# Patient Record
Sex: Male | Born: 1990 | Hispanic: Yes | Marital: Single | Smoking: Never smoker
Health system: Southern US, Community
[De-identification: ages and names within clinical notes are randomized; demographics above are authoritative.]

## PROBLEM LIST (undated history)

## (undated) ENCOUNTER — Emergency Department (HOSPITAL_COMMUNITY): Admission: EM | Payer: No Typology Code available for payment source | Source: Home / Self Care

---

## 2018-08-31 ENCOUNTER — Emergency Department (HOSPITAL_COMMUNITY): Payer: No Typology Code available for payment source

## 2018-08-31 ENCOUNTER — Other Ambulatory Visit: Payer: Self-pay

## 2018-08-31 ENCOUNTER — Encounter (HOSPITAL_COMMUNITY): Payer: Self-pay | Admitting: *Deleted

## 2018-08-31 ENCOUNTER — Emergency Department (HOSPITAL_COMMUNITY)
Admission: EM | Admit: 2018-08-31 | Discharge: 2018-08-31 | Disposition: A | Discharge status: Home / Self Care | Payer: No Typology Code available for payment source | Attending: Emergency Medicine | Admitting: Emergency Medicine

## 2018-08-31 DIAGNOSIS — Y999 Unspecified external cause status: Secondary | ICD-10-CM | POA: Diagnosis not present

## 2018-08-31 DIAGNOSIS — Y9389 Activity, other specified: Secondary | ICD-10-CM | POA: Diagnosis not present

## 2018-08-31 DIAGNOSIS — S8001XA Contusion of right knee, initial encounter: Secondary | ICD-10-CM | POA: Diagnosis not present

## 2018-08-31 DIAGNOSIS — S00512A Abrasion of oral cavity, initial encounter: Secondary | ICD-10-CM | POA: Insufficient documentation

## 2018-08-31 DIAGNOSIS — Y92410 Unspecified street and highway as the place of occurrence of the external cause: Secondary | ICD-10-CM | POA: Insufficient documentation

## 2018-08-31 DIAGNOSIS — S0993XA Unspecified injury of face, initial encounter: Secondary | ICD-10-CM | POA: Diagnosis present

## 2018-08-31 DIAGNOSIS — S1093XA Contusion of unspecified part of neck, initial encounter: Secondary | ICD-10-CM

## 2018-08-31 MED ORDER — NAPROXEN 500 MG PO TABS: 500.0000 mg | ORAL_TABLET | Freq: Two times a day (BID) | ORAL | 0 refills | Status: AC

## 2018-08-31 MED ORDER — IBUPROFEN 400 MG PO TABS
600.0000 mg | ORAL_TABLET | Freq: Once | ORAL | Status: AC
  Administered 2018-08-31: 600 mg via ORAL
  Filled 2018-08-31: qty 1

## 2018-08-31 MED ORDER — CYCLOBENZAPRINE HCL 10 MG PO TABS
5.0000 mg | ORAL_TABLET | Freq: Once | ORAL | Status: AC
  Administered 2018-08-31: 5 mg via ORAL
  Filled 2018-08-31: qty 1

## 2018-08-31 MED ORDER — LIDOCAINE VISCOUS HCL 2 % MT SOLN: 15.0000 mL | OROMUCOSAL | 0 refills | Status: AC | PRN

## 2018-08-31 MED ORDER — CYCLOBENZAPRINE HCL 10 MG PO TABS: 10.0000 mg | ORAL_TABLET | Freq: Two times a day (BID) | ORAL | 0 refills | Status: AC | PRN
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
## 2018-08-31 NOTE — ED Notes (Signed)
RN informed Pt can receive visitor  

## 2018-08-31 NOTE — ED Notes (Signed)
RN informed Pt can receive 2 visitors 
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
## 2018-08-31 NOTE — Discharge Instructions (Signed)
Thank you for allowing me to care for you today. Please return to the emergency department if you have new or worsening symptoms. Take your medications as instructed.  ° °
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
## 2018-11-21 ENCOUNTER — Other Ambulatory Visit: Payer: Self-pay

## 2018-11-21 ENCOUNTER — Emergency Department (HOSPITAL_COMMUNITY): Payer: No Typology Code available for payment source

## 2018-11-21 ENCOUNTER — Encounter (HOSPITAL_COMMUNITY): Payer: Self-pay | Admitting: *Deleted

## 2018-11-21 ENCOUNTER — Observation Stay (HOSPITAL_COMMUNITY)
Admission: EM | Admit: 2018-11-21 | Discharge: 2018-11-24 | Disposition: A | Discharge status: Home / Self Care | Payer: No Typology Code available for payment source | Attending: Surgery | Admitting: Surgery

## 2018-11-21 DIAGNOSIS — S39848A Other specified injuries of external genitals, initial encounter: Secondary | ICD-10-CM | POA: Diagnosis present

## 2018-11-21 DIAGNOSIS — S270XXA Traumatic pneumothorax, initial encounter: Secondary | ICD-10-CM | POA: Diagnosis not present

## 2018-11-21 DIAGNOSIS — U071 COVID-19: Secondary | ICD-10-CM

## 2018-11-21 DIAGNOSIS — S3022XA Contusion of scrotum and testes, initial encounter: Secondary | ICD-10-CM | POA: Insufficient documentation

## 2018-11-21 DIAGNOSIS — J939 Pneumothorax, unspecified: Secondary | ICD-10-CM

## 2018-11-21 DIAGNOSIS — S3994XA Unspecified injury of external genitals, initial encounter: Secondary | ICD-10-CM | POA: Diagnosis present

## 2018-11-21 LAB — CBC WITH DIFFERENTIAL/PLATELET
Abs Immature Granulocytes: 0.01 10*3/uL (ref 0.00–0.07)
Basophils Absolute: 0 10*3/uL (ref 0.0–0.1)
Basophils Relative: 0 %
Eosinophils Absolute: 0.1 10*3/uL (ref 0.0–0.5)
Eosinophils Relative: 2 %
HCT: 42.7 % (ref 39.0–52.0)
Hemoglobin: 14.8 g/dL (ref 13.0–17.0)
Immature Granulocytes: 0 %
Lymphocytes Relative: 29 %
Lymphs Abs: 1.3 10*3/uL (ref 0.7–4.0)
MCH: 30.8 pg (ref 26.0–34.0)
MCHC: 34.7 g/dL (ref 30.0–36.0)
MCV: 88.8 fL (ref 80.0–100.0)
Monocytes Absolute: 0.8 10*3/uL (ref 0.1–1.0)
Monocytes Relative: 18 %
Neutro Abs: 2.1 10*3/uL (ref 1.7–7.7)
Neutrophils Relative %: 51 %
Platelets: 143 10*3/uL — ABNORMAL LOW (ref 150–400)
RBC: 4.81 MIL/uL (ref 4.22–5.81)
RDW: 11.6 % (ref 11.5–15.5)
WBC: 4.3 10*3/uL (ref 4.0–10.5)
nRBC: 0 % (ref 0.0–0.2)

## 2018-11-21 LAB — COMPREHENSIVE METABOLIC PANEL
ALT: 22 U/L (ref 0–44)
AST: 23 U/L (ref 15–41)
Albumin: 4 g/dL (ref 3.5–5.0)
Alkaline Phosphatase: 56 U/L (ref 38–126)
Anion gap: 7 (ref 5–15)
BUN: 17 mg/dL (ref 6–20)
CO2: 24 mmol/L (ref 22–32)
Calcium: 8.7 mg/dL — ABNORMAL LOW (ref 8.9–10.3)
Chloride: 109 mmol/L (ref 98–111)
Creatinine, Ser: 1.21 mg/dL (ref 0.61–1.24)
GFR calc Af Amer: >60 mL/min (ref >60)
GFR calc non Af Amer: >60 mL/min (ref >60)
Glucose, Bld: 112 mg/dL — ABNORMAL HIGH (ref 70–99)
Potassium: 4.6 mmol/L (ref 3.5–5.1)
Sodium: 140 mmol/L (ref 135–145)
Total Bilirubin: 0.5 mg/dL (ref 0.3–1.2)
Total Protein: 6.6 g/dL (ref 6.5–8.1)

## 2018-11-21 LAB — ABO/RH: ABO/RH(D): O POS

## 2018-11-21 LAB — TYPE AND SCREEN
ABO/RH(D): O POS
Antibody Screen: NEGATIVE

## 2018-11-21 LAB — I-STAT CHEM 8, ED
BUN: 22 mg/dL — ABNORMAL HIGH (ref 6–20)
Calcium, Ion: 1.11 mmol/L — ABNORMAL LOW (ref 1.15–1.40)
Chloride: 108 mmol/L (ref 98–111)
Creatinine, Ser: 1.2 mg/dL (ref 0.61–1.24)
Glucose, Bld: 110 mg/dL — ABNORMAL HIGH (ref 70–99)
HCT: 40 % (ref 39.0–52.0)
Hemoglobin: 13.6 g/dL (ref 13.0–17.0)
Potassium: 4.6 mmol/L (ref 3.5–5.1)
Sodium: 140 mmol/L (ref 135–145)
TCO2: 27 mmol/L (ref 22–32)

## 2018-11-21 LAB — LACTIC ACID, PLASMA: Lactic Acid, Venous: 1.1 mmol/L (ref 0.5–1.9)

## 2018-11-21 LAB — ETHANOL: Alcohol, Ethyl (B): <10 mg/dL (ref <10)

## 2018-11-21 LAB — SARS CORONAVIRUS 2 BY RT PCR (HOSPITAL ORDER, PERFORMED IN ~~LOC~~ HOSPITAL LAB): SARS Coronavirus 2: POSITIVE — AB

## 2018-11-21 LAB — PROTIME-INR
INR: 1.1 (ref 0.8–1.2)
Prothrombin Time: 14 seconds (ref 11.4–15.2)

## 2018-11-21 MED ORDER — IOHEXOL 300 MG/ML  SOLN
100.0000 mL | Freq: Once | INTRAMUSCULAR | Status: AC | PRN
  Administered 2018-11-21: 100 mL via INTRAVENOUS

## 2018-11-21 MED ORDER — FENTANYL CITRATE (PF) 100 MCG/2ML IJ SOLN
100.0000 ug | Freq: Once | INTRAMUSCULAR | Status: AC
  Administered 2018-11-21: 100 ug via INTRAVENOUS
  Filled 2018-11-21: qty 2

## 2018-11-21 MED ORDER — HYDROMORPHONE HCL 1 MG/ML IJ SOLN
1.0000 mg | Freq: Once | INTRAMUSCULAR | Status: AC
  Administered 2018-11-21: 1 mg via INTRAVENOUS
  Filled 2018-11-21: qty 1
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
## 2018-11-21 NOTE — ED Notes (Signed)
Pt remains in ultrasound for testing.

## 2018-11-21 NOTE — ED Notes (Signed)
Ultrasound tech/ ct aware pt will go to ct after [place]
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
## 2018-11-21 NOTE — ED Notes (Signed)
U/s called, pt is having increased pain, notified EDP for further orders.

## 2018-11-21 NOTE — ED Notes (Signed)
Shawnn is pt father, his number is [phone]

## 2018-11-21 NOTE — ED Notes (Signed)
Family updated as to patient's status in waiting area. Discussed visiting policy, they will be in the parking lot.

## 2018-11-22 ENCOUNTER — Encounter (HOSPITAL_COMMUNITY): Payer: Self-pay | Admitting: Anesthesiology

## 2018-11-22 ENCOUNTER — Encounter (HOSPITAL_COMMUNITY)
Admission: EM | Disposition: A | Discharge status: Home / Self Care | Payer: Self-pay | Source: Home / Self Care | Attending: Emergency Medicine

## 2018-11-22 ENCOUNTER — Observation Stay (HOSPITAL_COMMUNITY): Payer: No Typology Code available for payment source | Admitting: Anesthesiology

## 2018-11-22 DIAGNOSIS — U071 COVID-19: Secondary | ICD-10-CM

## 2018-11-22 DIAGNOSIS — J939 Pneumothorax, unspecified: Secondary | ICD-10-CM

## 2018-11-22 DIAGNOSIS — S3994XA Unspecified injury of external genitals, initial encounter: Secondary | ICD-10-CM | POA: Diagnosis present

## 2018-11-22 HISTORY — PX: SCROTAL EXPLORATION: SHX2386

## 2018-11-22 LAB — RAPID URINE DRUG SCREEN, HOSP PERFORMED
Amphetamines: NOT DETECTED
Barbiturates: NOT DETECTED
Benzodiazepines: POSITIVE — AB
Cocaine: NOT DETECTED
Opiates: POSITIVE — AB
Tetrahydrocannabinol: NOT DETECTED

## 2018-11-22 LAB — CBC
HCT: 40.8 % (ref 39.0–52.0)
Hemoglobin: 14.5 g/dL (ref 13.0–17.0)
MCH: 31.2 pg (ref 26.0–34.0)
MCHC: 35.5 g/dL (ref 30.0–36.0)
MCV: 87.7 fL (ref 80.0–100.0)
Platelets: 135 10*3/uL — ABNORMAL LOW (ref 150–400)
RBC: 4.65 MIL/uL (ref 4.22–5.81)
RDW: 11.6 % (ref 11.5–15.5)
WBC: 6.3 10*3/uL (ref 4.0–10.5)
nRBC: 0 % (ref 0.0–0.2)

## 2018-11-22 LAB — URINALYSIS, ROUTINE W REFLEX MICROSCOPIC
Bilirubin Urine: NEGATIVE
Glucose, UA: NEGATIVE mg/dL
Hgb urine dipstick: NEGATIVE
Ketones, ur: NEGATIVE mg/dL
Leukocytes,Ua: NEGATIVE
Nitrite: NEGATIVE
Protein, ur: NEGATIVE mg/dL
Specific Gravity, Urine: 1.019 (ref 1.005–1.030)
pH: 6 (ref 5.0–8.0)

## 2018-11-22 LAB — CREATININE, SERUM
Creatinine, Ser: 1.15 mg/dL (ref 0.61–1.24)
GFR calc Af Amer: >60 mL/min (ref >60)
GFR calc non Af Amer: >60 mL/min (ref >60)

## 2018-11-22 LAB — FERRITIN: Ferritin: 158 ng/mL (ref 24–336)

## 2018-11-22 LAB — LACTATE DEHYDROGENASE: LDH: 145 U/L (ref 98–192)

## 2018-11-22 LAB — HIV ANTIBODY (ROUTINE TESTING W REFLEX): HIV Screen 4th Generation wRfx: NONREACTIVE

## 2018-11-22 LAB — C-REACTIVE PROTEIN: CRP: <0.8 mg/dL (ref <1.0)

## 2018-11-22 LAB — D-DIMER, QUANTITATIVE: D-Dimer, Quant: 0.49 ug/mL-FEU (ref 0.00–0.50)

## 2018-11-22 LAB — FIBRINOGEN: Fibrinogen: 334 mg/dL (ref 210–475)

## 2018-11-22 LAB — PROCALCITONIN: Procalcitonin: <0.1 ng/mL

## 2018-11-22 LAB — SEDIMENTATION RATE: Sed Rate: 7 mm/hr (ref 0–16)

## 2018-11-22 SURGERY — EXPLORATION, SCROTUM
Anesthesia: General | Site: Scrotum | Laterality: Bilateral

## 2018-11-22 MED ORDER — MIDAZOLAM HCL 5 MG/5ML IJ SOLN
INTRAMUSCULAR | Status: DC | PRN
  Administered 2018-11-22: 2 mg via INTRAVENOUS

## 2018-11-22 MED ORDER — VITAMIN C 500 MG PO TABS
500.0000 mg | ORAL_TABLET | Freq: Two times a day (BID) | ORAL | Status: DC
  Administered 2018-11-22 – 2018-11-24 (×5): 500 mg via ORAL
  Filled 2018-11-22 (×5): qty 1

## 2018-11-22 MED ORDER — FENTANYL CITRATE (PF) 100 MCG/2ML IJ SOLN
INTRAMUSCULAR | Status: DC | PRN
  Administered 2018-11-22: 100 ug via INTRAVENOUS

## 2018-11-22 MED ORDER — ZINC SULFATE 220 (50 ZN) MG PO CAPS
220.0000 mg | ORAL_CAPSULE | Freq: Every day | ORAL | Status: DC
  Administered 2018-11-22 – 2018-11-24 (×3): 220 mg via ORAL
  Filled 2018-11-22 (×3): qty 1

## 2018-11-22 MED ORDER — SUCCINYLCHOLINE CHLORIDE 20 MG/ML IJ SOLN
INTRAMUSCULAR | Status: DC | PRN
  Administered 2018-11-22: 120 mg via INTRAVENOUS

## 2018-11-22 MED ORDER — SODIUM CHLORIDE 0.9 % IV SOLN
INTRAVENOUS | Status: DC
  Administered 2018-11-22 – 2018-11-23 (×4): via INTRAVENOUS

## 2018-11-22 MED ORDER — ACETAMINOPHEN 10 MG/ML IV SOLN
INTRAVENOUS | Status: AC
  Filled 2018-11-22: qty 100

## 2018-11-22 MED ORDER — ONDANSETRON 4 MG PO TBDP: 4.0000 mg | ORAL_TABLET | Freq: Four times a day (QID) | ORAL | Status: DC | PRN

## 2018-11-22 MED ORDER — ROCURONIUM BROMIDE 50 MG/5ML IV SOSY
PREFILLED_SYRINGE | INTRAVENOUS | Status: DC | PRN
  Administered 2018-11-22: 40 mg via INTRAVENOUS

## 2018-11-22 MED ORDER — HYDROMORPHONE HCL 1 MG/ML IJ SOLN
1.0000 mg | Freq: Once | INTRAMUSCULAR | Status: AC
  Administered 2018-11-22: 02:00:00 1 mg via INTRAVENOUS
  Filled 2018-11-22: qty 1

## 2018-11-22 MED ORDER — DEXAMETHASONE SODIUM PHOSPHATE 10 MG/ML IJ SOLN
INTRAMUSCULAR | Status: DC | PRN
  Administered 2018-11-22: 4 mg via INTRAVENOUS

## 2018-11-22 MED ORDER — LIDOCAINE 2% (20 MG/ML) 5 ML SYRINGE
INTRAMUSCULAR | Status: AC
  Filled 2018-11-22: qty 5

## 2018-11-22 MED ORDER — PROPOFOL 10 MG/ML IV BOLUS
INTRAVENOUS | Status: DC | PRN
  Administered 2018-11-22: 200 mg via INTRAVENOUS

## 2018-11-22 MED ORDER — OXYCODONE HCL 5 MG PO TABS
10.0000 mg | ORAL_TABLET | ORAL | Status: DC | PRN
  Administered 2018-11-22 – 2018-11-23 (×3): 10 mg via ORAL
  Filled 2018-11-22 (×5): qty 2

## 2018-11-22 MED ORDER — BUPIVACAINE HCL (PF) 0.25 % IJ SOLN
INTRAMUSCULAR | Status: DC | PRN
  Administered 2018-11-22: 17 mL

## 2018-11-22 MED ORDER — HYDROMORPHONE HCL 1 MG/ML IJ SOLN
1.0000 mg | INTRAMUSCULAR | Status: DC | PRN
  Administered 2018-11-22 – 2018-11-23 (×6): 1 mg via INTRAVENOUS
  Filled 2018-11-22 (×6): qty 1

## 2018-11-22 MED ORDER — CEFAZOLIN SODIUM-DEXTROSE 2-4 GM/100ML-% IV SOLN
2.0000 g | Freq: Once | INTRAVENOUS | Status: AC
  Administered 2018-11-22: 2 g via INTRAVENOUS
  Filled 2018-11-22: qty 100

## 2018-11-22 MED ORDER — SUGAMMADEX SODIUM 200 MG/2ML IV SOLN
INTRAVENOUS | Status: DC | PRN
  Administered 2018-11-22: 200 mg via INTRAVENOUS

## 2018-11-22 MED ORDER — PROPOFOL 10 MG/ML IV BOLUS
INTRAVENOUS | Status: AC
  Filled 2018-11-22: qty 20

## 2018-11-22 MED ORDER — FENTANYL CITRATE (PF) 250 MCG/5ML IJ SOLN
INTRAMUSCULAR | Status: AC
  Filled 2018-11-22: qty 5

## 2018-11-22 MED ORDER — OXYCODONE HCL 5 MG PO TABS
5.0000 mg | ORAL_TABLET | ORAL | Status: DC | PRN
  Administered 2018-11-22: 5 mg via ORAL
  Filled 2018-11-22: qty 1

## 2018-11-22 MED ORDER — ONDANSETRON HCL 4 MG/2ML IJ SOLN
INTRAMUSCULAR | Status: DC | PRN
  Administered 2018-11-22: 4 mg via INTRAVENOUS

## 2018-11-22 MED ORDER — SUCCINYLCHOLINE CHLORIDE 200 MG/10ML IV SOSY
PREFILLED_SYRINGE | INTRAVENOUS | Status: AC
  Filled 2018-11-22: qty 10

## 2018-11-22 MED ORDER — ONDANSETRON HCL 4 MG/2ML IJ SOLN
4.0000 mg | Freq: Four times a day (QID) | INTRAMUSCULAR | Status: DC | PRN
  Administered 2018-11-23: 4 mg via INTRAVENOUS
  Filled 2018-11-22 (×2): qty 2

## 2018-11-22 MED ORDER — ONDANSETRON HCL 4 MG/2ML IJ SOLN
INTRAMUSCULAR | Status: AC
  Filled 2018-11-22: qty 2

## 2018-11-22 MED ORDER — ACETAMINOPHEN 10 MG/ML IV SOLN
INTRAVENOUS | Status: DC | PRN
  Administered 2018-11-22: 1000 mg via INTRAVENOUS

## 2018-11-22 MED ORDER — ENOXAPARIN SODIUM 40 MG/0.4ML ~~LOC~~ SOLN
40.0000 mg | SUBCUTANEOUS | Status: DC
  Administered 2018-11-22 – 2018-11-23 (×2): 40 mg via SUBCUTANEOUS
  Filled 2018-11-22 (×2): qty 0.4

## 2018-11-22 MED ORDER — BUPIVACAINE HCL (PF) 0.25 % IJ SOLN
INTRAMUSCULAR | Status: AC
  Filled 2018-11-22: qty 30

## 2018-11-22 MED ORDER — LACTATED RINGERS IV SOLN
INTRAVENOUS | Status: DC | PRN
  Administered 2018-11-22: 03:00:00 via INTRAVENOUS

## 2018-11-22 MED ORDER — ROCURONIUM BROMIDE 10 MG/ML (PF) SYRINGE
PREFILLED_SYRINGE | INTRAVENOUS | Status: AC
  Filled 2018-11-22: qty 10

## 2018-11-22 MED ORDER — ACETAMINOPHEN 325 MG PO TABS
650.0000 mg | ORAL_TABLET | ORAL | Status: DC | PRN
  Administered 2018-11-23: 650 mg via ORAL
  Filled 2018-11-22: qty 2

## 2018-11-22 MED ORDER — DEXAMETHASONE SODIUM PHOSPHATE 10 MG/ML IJ SOLN
INTRAMUSCULAR | Status: AC
  Filled 2018-11-22: qty 1

## 2018-11-22 MED ORDER — MIDAZOLAM HCL 2 MG/2ML IJ SOLN
INTRAMUSCULAR | Status: AC
  Filled 2018-11-22: qty 2

## 2018-11-22 MED ORDER — LIDOCAINE 2% (20 MG/ML) 5 ML SYRINGE
INTRAMUSCULAR | Status: DC | PRN
  Administered 2018-11-22: 90 mg via INTRAVENOUS

## 2018-11-22 SURGICAL SUPPLY — 35 items
BLADE 10 SAFETY STRL DISP (BLADE) ×3 IMPLANT
BLADE SURG 15 STRL LF DISP TIS (BLADE) ×2 IMPLANT
BLADE SURG 15 STRL SS (BLADE) ×4
BNDG GAUZE ELAST 4 BULKY (GAUZE/BANDAGES/DRESSINGS) ×3 IMPLANT
BRIEF STRETCH FOR OB PAD LRG (UNDERPADS AND DIAPERS) ×3 IMPLANT
CANISTER SUCT 3000ML PPV (MISCELLANEOUS) ×3 IMPLANT
CONT SPEC 4OZ CLIKSEAL STRL BL (MISCELLANEOUS) ×3 IMPLANT
DERMABOND ADVANCED (GAUZE/BANDAGES/DRESSINGS) ×2
DERMABOND ADVANCED .7 DNX12 (GAUZE/BANDAGES/DRESSINGS) ×1 IMPLANT
DRAIN PENROSE 1/4X12 LTX STRL (WOUND CARE) ×3 IMPLANT
DRAPE LAPAROTOMY T 102X78X121 (DRAPES) ×3 IMPLANT
DRSG PAD ABDOMINAL 8X10 ST (GAUZE/BANDAGES/DRESSINGS) ×6 IMPLANT
ELECT REM PT RETURN 9FT ADLT (ELECTROSURGICAL) ×3
ELECTRODE REM PT RTRN 9FT ADLT (ELECTROSURGICAL) ×1 IMPLANT
GAUZE SPONGE 4X4 12PLY STRL (GAUZE/BANDAGES/DRESSINGS) ×3 IMPLANT
GLOVE BIOGEL PI IND STRL 7.0 (GLOVE) ×1 IMPLANT
GLOVE BIOGEL PI INDICATOR 7.0 (GLOVE) ×2
GLOVE SURG ORTHO 8.0 STRL STRW (GLOVE) ×3 IMPLANT
GLOVE SURG SS PI 7.0 STRL IVOR (GLOVE) ×6 IMPLANT
KIT BASIN OR (CUSTOM PROCEDURE TRAY) ×3 IMPLANT
KIT TURNOVER KIT B (KITS) ×3 IMPLANT
NEEDLE HYPO 25GX1X1/2 BEV (NEEDLE) ×3 IMPLANT
NS IRRIG 1000ML POUR BTL (IV SOLUTION) ×3 IMPLANT
PACK GENERAL/GYN (CUSTOM PROCEDURE TRAY) ×3 IMPLANT
PAD ARMBOARD 7.5X6 YLW CONV (MISCELLANEOUS) ×6 IMPLANT
SOL PREP POV-IOD 4OZ 10% (MISCELLANEOUS) ×3 IMPLANT
SUT CHROMIC 3 0 SH 27 (SUTURE) ×6 IMPLANT
SUT MNCRL AB 4-0 PS2 18 (SUTURE) ×3 IMPLANT
SUT VIC AB 3-0 SH 27 (SUTURE) ×4
SUT VIC AB 3-0 SH 27X BRD (SUTURE) ×2 IMPLANT
SUT VIC AB 4-0 PS2 18 (SUTURE) ×3 IMPLANT
SUT VICRYL 4-0 PS2 18IN ABS (SUTURE) IMPLANT
SYR CONTROL 10ML LL (SYRINGE) ×3 IMPLANT
TOWEL GREEN STERILE (TOWEL DISPOSABLE) ×3 IMPLANT
TOWEL OR 17X26 10 PK STRL BLUE (TOWEL DISPOSABLE) ×3 IMPLANT

## 2018-11-22 NOTE — Progress Notes (Signed)
[facility] PLAN OF CARE NOTE Patient: Curtis Bartlett IRW:[id number]   PCP: Patient, No Pcp Per DOB: 11/09/1990   DOA: 11/21/2018   DOS: 11/22/2018    Patient was admitted by my colleague Dr. Loney Loh earlier on 11/22/2018. I have reviewed the consultation as well as assessment and plan and agree with the same. Important changes in the plan are listed below.  Plan of care: Principal Problem:   COVID-19 virus infection Active Problems:   Testicular injury   Pneumothorax Markers for inflammation are reassuring. Continue close monitoring. Patient does not require any oxygen for now. Stable to be discharged home from medical perspective with COVID isolation recommendation.  Author: Lynden Oxford, MD [facility] 11/22/2018 4:45 PM   If 7PM-7AM, please contact night-coverage at [email or web address]
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
## 2018-11-22 NOTE — Anesthesia Procedure Notes (Signed)
Procedure Name: Intubation Date/Time: 11/22/2018 3:11 AM Performed by: Edmonia Caprio, CRNA Pre-anesthesia Checklist: Patient identified, Emergency Drugs available, Patient being monitored, Timeout performed and Suction available Patient Re-evaluated:Patient Re-evaluated prior to induction Oxygen Delivery Method: Circle system utilized Preoxygenation: Pre-oxygenation with 100% oxygen Induction Type: Rapid sequence and Cricoid Pressure applied Laryngoscope Size: Glidescope and 4 Grade View: Grade I Tube type: Oral Tube size: 7.5 mm Number of attempts: 1 Airway Equipment and Method: Stylet and Video-laryngoscopy Placement Confirmation: ETT inserted through vocal cords under direct vision,  positive ETCO2 and breath sounds checked- equal and bilateral Secured at: 23 cm Tube secured with: Tape Dental Injury: Teeth and Oropharynx as per pre-operative assessment  Comments: Full COVID precautions taken
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
## 2018-11-22 NOTE — Anesthesia Preprocedure Evaluation (Signed)
Anesthesia Evaluation  Patient identified by MRN, date of birth, ID band Patient awake    Reviewed: Allergy & Precautions, NPO status , Patient's Chart, lab work & pertinent test results  Airway Mallampati: II  TM Distance: >3 FB Neck ROM: Full    Dental no notable dental hx. (+) Dental Advisory Given   Pulmonary neg pulmonary ROS,    Pulmonary exam normal        Cardiovascular negative cardio ROS Normal cardiovascular exam     Neuro/Psych negative neurological ROS  negative psych ROS   GI/Hepatic negative GI ROS, Neg liver ROS,   Endo/Other  negative endocrine ROS  Renal/GU negative Renal ROS     Musculoskeletal negative musculoskeletal ROS (+)   Abdominal   Peds negative pediatric ROS (+)  Hematology negative hematology ROS (+)   Anesthesia Other Findings   Reproductive/Obstetrics negative OB ROS                             Anesthesia Physical Anesthesia Plan  ASA: II and emergent  Anesthesia Plan: General   Post-op Pain Management:    Induction: Intravenous, Rapid sequence and Cricoid pressure planned  PONV Risk Score and Plan: 2 and Ondansetron and Dexamethasone  Airway Management Planned: Oral ETT  Additional Equipment:   Intra-op Plan:   Post-operative Plan: Extubation in OR  Informed Consent: I have reviewed the patients History and Physical, chart, labs and discussed the procedure including the risks, benefits and alternatives for the proposed anesthesia with the patient or authorized representative who has indicated his/her understanding and acceptance.     Dental advisory given  Plan Discussed with: CRNA, Anesthesiologist and Surgeon  Anesthesia Plan Comments:         Anesthesia Quick Evaluation
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
## 2018-11-22 NOTE — ED Notes (Signed)
[facility] is at the bedside, hospitalist wants to see pt prior to transfer to floor

## 2018-11-22 NOTE — ED Notes (Signed)
Report called to nancy in the OR, okay to send abx with pt

## 2018-11-22 NOTE — Progress Notes (Signed)
Urine collected and sent to lab for UDS and UA

## 2018-11-22 NOTE — Anesthesia Postprocedure Evaluation (Signed)
Anesthesia Post Note  Patient: Curtis Bartlett  Procedure(s) Performed: BILATERAL SCROTUM EXPLORATION, LEFT TESTICULAR REPAIR, (Bilateral Scrotum)     Patient location during evaluation: Other Anesthesia Type: General Level of consciousness: awake and alert Pain management: pain level controlled Vital Signs Assessment: post-procedure vital signs reviewed and stable Respiratory status: spontaneous breathing, nonlabored ventilation, respiratory function stable and patient connected to nasal cannula oxygen Cardiovascular status: blood pressure returned to baseline and stable Postop Assessment: no apparent nausea or vomiting Anesthetic complications: no    Last Vitals:  Vitals:   11/22/18 0230 11/22/18 0245  BP: 126/60 123/64  Pulse: 68 67  Resp: 13 12  Temp:    SpO2: 96% 97%    Last Pain:  Vitals:   11/22/18 0136  TempSrc: Oral  PainSc:                  Adalae Baysinger DANIEL

## 2018-11-22 NOTE — ED Notes (Signed)
Per OR, they will be ready in about 30 minutes for the patient.
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
## 2018-11-22 NOTE — Transfer of Care (Signed)
Immediate Anesthesia Transfer of Care Note  Patient: Curtis Bartlett  Procedure(s) Performed: BILATERAL SCROTUM EXPLORATION, LEFT TESTICULAR REPAIR, (Bilateral Scrotum)  Patient Location: Nursing Unit  Anesthesia Type:General  Level of Consciousness: awake, alert  and oriented  Airway & Oxygen Therapy: Patient Spontanous Breathing and Patient connected to nasal cannula oxygen  Post-op Assessment: Report given to RN, Post -op Vital signs reviewed and stable and Patient moving all extremities X 4  Post vital signs: Reviewed and stable  Last Vitals:  Vitals Value Taken Time  BP    Temp    Pulse    Resp    SpO2      Last Pain:  Vitals:   11/22/18 0516  TempSrc: Oral  PainSc: 0-No pain         Complications: No apparent anesthesia complications
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
## 2018-11-23 ENCOUNTER — Observation Stay (HOSPITAL_COMMUNITY): Payer: No Typology Code available for payment source

## 2018-11-23 DIAGNOSIS — U071 COVID-19: Secondary | ICD-10-CM | POA: Diagnosis not present

## 2018-11-23 MED ORDER — OXYCODONE HCL 5 MG PO TABS: 5.0000 mg | ORAL_TABLET | ORAL | 0 refills | Status: AC | PRN
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
## 2018-11-23 NOTE — Progress Notes (Addendum)
After refusing to ambulate, pt agreeing to walk with nurse pending discharge.  Pt in 9/10 L side lower abdominal/pubic area pain.  Pt unable to stand up straight due to sharp pain.  Pt took one step and had to sit back down on the bed.  MD notified & orders received for PT eval.
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
## 2018-11-23 NOTE — Discharge Instructions (Signed)
HOME CARE INSTRUCTIONS FOR SCROTAL PROCEDURES  Wound Care & Hygiene: You may apply an ice bag to the scrotum for the first 24 hours.  This may help decrease swelling and soreness.  You may have a dressing held in place by an athletic supporter.  You may remove the dressing in 24 hours and shower in 48 hours.  Continue to use the athletic supporter or tight briefs for at least a week. Activity: Rest today - not necessarily flat bed rest.  Just take it easy.  You should not do strenuous activities until your follow-up visit with your doctor.  You may resume light activity in 48 hours.  Return to Work:  Your doctor will advise you of this depending on the type of work you do  Diet: Drink liquids or eat a light diet this evening.  You may resume a regular diet tomorrow.  General Expectations: You may have a small amount of bleeding.  The scrotum may be swollen or bruised for about a week.  Call your Doctor if these occur:  -persistent or heavy bleeding  -temperature of 101 degrees or more  -severe pain, not relieved by your pain medication  Return to [place]:  Call to set up and appointment.  Patient Signature:  __________________________________________________  Nurse's Signature:  __________________________________________________      Person Under Monitoring Name: Curtis Bartlett  Location: [address] [place] [address]   Infection Prevention Recommendations for Individuals Confirmed to have, or Being Evaluated for, 2019 Novel Coronavirus (COVID-19) Infection Who Receive Care at Home  Individuals who are confirmed to have, or are being evaluated for, COVID-19 should follow the prevention steps below until a healthcare provider or local or state health department says they can return to normal activities.  Stay home except to get medical care You should restrict activities outside your home, except for getting medical care. Do not go to work,  school, or public areas, and do not use public transportation or taxis.  Call ahead before visiting your doctor Before your medical appointment, call the healthcare provider and tell them that you have, or are being evaluated for, COVID-19 infection. This will help the healthcare providers office take steps to keep other people from getting infected. Ask your healthcare provider to call the local or state health department.  Monitor your symptoms Seek prompt medical attention if your illness is worsening (e.g., difficulty breathing). Before going to your medical appointment, call the healthcare provider and tell them that you have, or are being evaluated for, COVID-19 infection. Ask your healthcare provider to call the local or state health department.  Wear a facemask You should wear a facemask that covers your nose and mouth when you are in the same room with other people and when you visit a healthcare provider. People who live with or visit you should also wear a facemask while they are in the same room with you.  Separate yourself from other people in your home As much as possible, you should stay in a different room from other people in your home. Also, you should use a separate bathroom, if available.  Avoid sharing household items You should not share dishes, drinking glasses, cups, eating utensils, towels, bedding, or other items with other people in your home. After using these items, you should wash them thoroughly with soap and water.  Cover your coughs and sneezes Cover your mouth and nose with a tissue when you cough or sneeze, or you can cough  or sneeze into your sleeve. Throw used tissues in a lined trash can, and immediately wash your hands with soap and water for at least 20 seconds or use an alcohol-based hand rub.  Wash your [employer] your hands often and thoroughly with soap and water for at least 20 seconds. You can use an alcohol-based hand sanitizer if soap  and water are not available and if your hands are not visibly dirty. Avoid touching your eyes, nose, and mouth with unwashed hands.   Prevention Steps for Caregivers and Household Members of Individuals Confirmed to have, or Being Evaluated for, COVID-19 Infection Being Cared for in the Home  If you live with, or provide care at home for, a person confirmed to have, or being evaluated for, COVID-19 infection please follow these guidelines to prevent infection:  Follow healthcare providers instructions Make sure that you understand and can help the patient follow any healthcare provider instructions for all care.  Provide for the patients basic needs You should help the patient with basic needs in the home and provide support for getting groceries, prescriptions, and other personal needs.  Monitor the patients symptoms If they are getting sicker, call his or her medical provider and tell them that the patient has, or is being evaluated for, COVID-19 infection. This will help the healthcare providers office take steps to keep other people from getting infected. Ask the healthcare provider to call the local or state health department.  Limit the number of people who have contact with the patient  If possible, have only one caregiver for the patient.  Other household members should stay in another home or place of residence. If this is not possible, they should stay  in another room, or be separated from the patient as much as possible. Use a separate bathroom, if available.  Restrict visitors who do not have an essential need to be in the home.  Keep older adults, very young children, and other sick people away from the patient Keep older adults, very young children, and those who have compromised immune systems or chronic health conditions away from the patient. This includes people with chronic heart, lung, or kidney conditions, diabetes, and cancer.  Ensure good  ventilation Make sure that shared spaces in the home have good air flow, such as from an air conditioner or an opened window, weather permitting.  Wash your hands often  Wash your hands often and thoroughly with soap and water for at least 20 seconds. You can use an alcohol based hand sanitizer if soap and water are not available and if your hands are not visibly dirty.  Avoid touching your eyes, nose, and mouth with unwashed hands.  Use disposable paper towels to dry your hands. If not available, use dedicated cloth towels and replace them when they become wet.  Wear a facemask and gloves  Wear a disposable facemask at all times in the room and gloves when you touch or have contact with the patients blood, body fluids, and/or secretions or excretions, such as sweat, saliva, sputum, nasal mucus, vomit, urine, or feces.  Ensure the mask fits over your nose and mouth tightly, and do not touch it during use.  Throw out disposable facemasks and gloves after using them. Do not reuse.  Wash your hands immediately after removing your facemask and gloves.  If your personal clothing becomes contaminated, carefully remove clothing and launder. Wash your hands after handling contaminated clothing.  Place all used disposable facemasks, gloves, and other  waste in a lined container before disposing them with other household waste.  Remove gloves and wash your hands immediately after handling these items.  Do not share dishes, glasses, or other household items with the patient  Avoid sharing household items. You should not share dishes, drinking glasses, cups, eating utensils, towels, bedding, or other items with a patient who is confirmed to have, or being evaluated for, COVID-19 infection.  After the person uses these items, you should wash them thoroughly with soap and water.  Wash laundry thoroughly  Immediately remove and wash clothes or bedding that have blood, body fluids, and/or  secretions or excretions, such as sweat, saliva, sputum, nasal mucus, vomit, urine, or feces, on them.  Wear gloves when handling laundry from the patient.  Read and follow directions on labels of laundry or clothing items and detergent. In general, wash and dry with the warmest temperatures recommended on the label.  Clean all areas the individual has used often  Clean all touchable surfaces, such as counters, tabletops, doorknobs, bathroom fixtures, toilets, phones, keyboards, tablets, and bedside tables, every day. Also, clean any surfaces that may have blood, body fluids, and/or secretions or excretions on them.  Wear gloves when cleaning surfaces the patient has come in contact with.  Use a diluted bleach solution (e.g., dilute bleach with 1 part bleach and 10 parts water) or a household disinfectant with a label that says EPA-registered for coronaviruses. To make a bleach solution at home, add 1 tablespoon of bleach to 1 quart (4 cups) of water. For a larger supply, add  cup of bleach to 1 gallon (16 cups) of water.  Read labels of cleaning products and follow recommendations provided on product labels. Labels contain instructions for safe and effective use of the cleaning product including precautions you should take when applying the product, such as wearing gloves or eye protection and making sure you have good ventilation during use of the product.  Remove gloves and wash hands immediately after cleaning.  Monitor yourself for signs and symptoms of illness Caregivers and household members are considered close contacts, should monitor their health, and will be asked to limit movement outside of the home to the extent possible. Follow the monitoring steps for close contacts listed on the symptom monitoring form.   ? If you have additional questions, contact your [phone] department or call the epidemiologist on call at [phone] (available 24/7). ? This guidance is subject  to change. For the most up-to-date guidance from [facility], please refer to their website: [email or web address]      Person Under Monitoring Name: Curtis Bartlett  Location: [address] [place] [address]   CORONAVIRUS DISEASE 2019 (COVID-19) Guidance for Persons Under Investigation You are being tested for the virus that causes coronavirus disease 2019 (COVID-19). Public health actions are necessary to ensure protection of your health and the health of others, and to prevent further spread of infection. COVID-19 is caused by a virus that can cause symptoms, such as fever, cough, and shortness of breath. The primary transmission from person to person is by coughing or sneezing. On July 30, 2018, the World Health Organization announced a [employer] Emergency of International Concern and on July 31, 2018 the U.S. Department of Health and Human Services declared a public health emergency. If the virus that causesCOVID-19 spreads in the community, it could have severe public health consequences.  As a person under investigation for COVID-19, the [employer], Division of Public Health advises you to adhere to the following guidance until your test results are reported to you. If your test result is positive, you will receive additional information from your provider and your local health department at that time.   Remain at home until you are cleared by your health provider or public health authorities.   Keep a log of visitors to your home using the form provided. Any visitors to your home must be aware of your isolation status.  If you plan to move to a new address or leave the county, notify the local health department in your county.  Call a doctor or seek care if you have an urgent medical need. Before seeking medical care, call ahead and get instructions from the provider  before arriving at the medical office, clinic or hospital. Notify them that you are being tested for the virus that causes COVID-19 so arrangements can be made, as necessary, to prevent transmission to others in the healthcare setting. Next, notify the local health department in your county.  If a medical emergency arises and you need to call 911, inform the first responders that you are being tested for the virus that causes COVID-19. Next, notify the local health department in your county.  Adhere to all guidance set forth by the [facility] of [facility] for [facility] of patients that is based on guidance from the Center for Disease Control and Prevention with suspected or confirmed COVID-19. It is provided with this guidance for Persons Under Investigation.  Your health and the health of our community are our top priorities. Public Health officials remain available to provide assistance and counseling to you about COVID-19 and compliance with this guidance.  Provider: ____________________________________________________________ Date: ______/_____/_________  By signing below, you acknowledge that you have read and agree to comply with this Guidance for Persons Under Investigation. ______________________________________________________________ Date: ______/_____/_________  WHO DO I CALL? You can find a list of local health departments here: [email or web address]://www.ncdhhs.gov/divisions/[email or web address]-health/[email or web address]-healthdepartments Health Department: ____________________________________________________________________ Contact Name: ________________________________________________________________________ Telephone: ___________________________________________________________________________  [place] [facility], Division of Public Health, Communicable Disease Branch COVID-19 Guidance for Persons Under Investigation September 05, 2018   What is COVID-19 convalescent plasma? Convalescent plasma (CPP) is  plasma collected from people who have recovered from the coronavirus. People who recover from coronavirus infection have developed antibodies to the virus that remain in the plasma portion of their blood. Transfusing the plasma that contains the antibodies into a person still fighting the virus can provide a boost to the patients immune system and potentially help them recover. The experimental treatment is approved by the FDA to be used on an emergency basis and is called COVID-19 convalescent plasma." Critically ill patients who meet the FDA criteria to receive this therapy can be treated for life-threatening COVID-19  Can I donate convalescent plasma or blood after being diagnosed with COVID-19? Donors must meet all the required screening criteria for blood donation and the additional FDA criteria, as follows:   Prior diagnosis of COVID-19 documented by an FDA approved laboratory test   A positive diagnostic test at the time of illness OR   A positive serological test for SARS-CoV-2 antibodies after recovery  Complete resolution of symptoms at least 14 days prior to donation and a documented negative COVID-19 FDA approved test OR   Complete resolution of symptoms at least 28 days prior to donation As a part of your pre-donation process you will be required to provide your COVID-19 test result(s).  Where can I donate COVID-19 convalescent plasma? You can complete the pre-donation form at [email or web address] and a donor specialist will be in contact within 24-48 hours.  What is the process for donating COVID-19 Convalescent Plasma?  1. The first step in the donation process is to obtain a copy of your test results or a letter from the testing facility notifying you of your positive result and the date it was taken 2. Complete the pre-donation form at [email or web address] 3. A representative will be in contact within 24-48 hours to provide additional insight into the process 4. Once the  process begins, [facility] will ensure you   Meet the required screening criteria for blood donation   Have the required test results   Meet the criteria for resolution of your symptoms   Complete resolution of symptoms at least 14 days prior to donation and a documented negative COVID-19 FDA approved test OR   Complete resolution of symptoms at least 28 days prior to donation 5. [facility] will then schedule a collection date and time to collect your COVID-19 convalescent plasma  Is the COVID-19 convalescent plasma donation safe? It is safe to donate COVID-19 convalescent plasma and done in the same way that thousands of blood donors donate blood products every day.  My family member or friend is being treated for COVID-19. Can I donate to help them if I am eligible? A directed donation will need to be arranged in conjunction with your family members care provider.  The care provider will determine whether COVID-19 convalescent plasma is the best course of treatment   A directed donation must be arranged through the Clinician Referral page that can be found at [email or web address]   The clinician will need to provide [facility] with information about both you and the patient for which the donation is directed   Please note there are additional qualifications that are required such as your blood type and the patients blood type We encourage you to consider beginning the pre-registration process even if your loved one will not be receiving COVID-19 convalescent plasma as a part of their medical treatment. The need for COVID-19 convalescent plasma increases daily and your donated COVID-19 convalescent plasma could be life-saving for another COVID-19 patient.  Please go to the website [email or web address] if you would like to consider volunteer for plasma donation.    People who have recovered from the coronavirus, can help a person still fighting the  virus and potentially help them recover by giving them convalescent plasma.  What is COVID-19 convalescent plasma? Convalescent plasma (CPP) is plasma collected from people who have recovered from the coronavirus. People who recover from coronavirus infection have developed antibodies to the virus that remain in the plasma portion of their blood. Transfusing the plasma that contains the antibodies into a person still fighting the virus can provide a boost to the patients immune system and potentially help them recover. The experimental treatment is approved by the FDA to be used on an emergency basis and is called COVID-19 convalescent plasma." Critically ill patients who meet the FDA criteria to receive this therapy can be treated for life-threatening COVID-19.  Can I donate convalescent plasma or blood after being diagnosed with COVID-19? Donors must meet all the required screening criteria for blood donation and the additional FDA criteria, as follows:   Prior diagnosis of COVID-19 documented by an FDA approved laboratory test   A positive diagnostic test at the time of illness OR   A positive serological test for SARS-CoV-2  antibodies after recovery  Complete resolution of symptoms at least 14 days prior to donation and a documented negative COVID-19 FDA approved test OR   Complete resolution of symptoms at least 28 days prior to donation As a part of your pre-donation process you will be required to provide your COVID-19 test result(s).  Where can I donate COVID-19 convalescent plasma? You can complete the pre-donation form at [email or web address] and a donor specialist will be in contact within 24-48 hours.  What is the process for donating COVID-19 Convalescent Plasma?  6. The first step in the donation process is to obtain a copy of your test results or a letter from the testing facility notifying you of your positive result and the date it was taken 7. Complete the pre-donation  form at [email or web address] 8. A representative will be in contact within 24-48 hours to provide additional insight into the process 9. Once the process begins, [facility] will ensure you   Meet the required screening criteria for blood donation   Have the required test results   Meet the criteria for resolution of your symptoms   Complete resolution of symptoms at least 14 days prior to donation and a documented negative COVID-19 FDA approved test OR   Complete resolution of symptoms at least 28 days prior to donation 10. [facility] will then schedule a collection date and time to collect your COVID-19 convalescent plasma  Is the COVID-19 convalescent plasma donation safe? It is safe to donate COVID-19 convalescent plasma and done in the same way that thousands of blood donors donate blood products every day.  My family member or friend is being treated for COVID-19. Can I donate to help them if I am eligible? A directed donation will need to be arranged in conjunction with your family members care provider.  The care provider will determine whether COVID-19 convalescent plasma is the best course of treatment   A directed donation must be arranged through the Clinician Referral page that can be found at [email or web address]   The clinician will need to provide [facility] with information about both you and the patient for which the donation is directed   Please note there are additional qualifications that are required such as your blood type and the patients blood type We encourage you to consider beginning the pre-registration process even if your loved one will not be receiving COVID-19 convalescent plasma as a part of their medical treatment. The need for COVID-19 convalescent plasma increases daily and your donated COVID-19 convalescent plasma could be life-saving for another COVID-19 patient.  Please go to the website [email or web address] if you would like to consider  volunteer for plasma donation.

## 2018-11-23 NOTE — Progress Notes (Signed)
Asked pt if able to assess scrotum, pt refused.

## 2018-11-23 NOTE — Progress Notes (Addendum)
Unclear whether or not pt has [place] drain.  Pt states that he does not have drain and whether or not drain needs removed before discharge tomorrow.  Refused to let RN assess thoroughly to look for signs of drain.  Passed along to night shift.
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
## 2018-11-23 NOTE — Progress Notes (Signed)
Pt struggled through pain during PT eval.  Per PT patient was able to walk.  Cleared to discharge per Trauma MD Attending.  Pt requesting to stay one more night d/t pain control.  MD notified and okayed.
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
## 2018-11-24 ENCOUNTER — Encounter (HOSPITAL_COMMUNITY): Payer: Self-pay | Admitting: Urology

## 2018-11-24 DIAGNOSIS — U071 COVID-19: Secondary | ICD-10-CM

## 2018-11-24 MED ORDER — CEPHALEXIN 250 MG PO CAPS: 250.0000 mg | ORAL_CAPSULE | Freq: Two times a day (BID) | ORAL | 0 refills | Status: AC

## 2018-11-24 MED ORDER — HYDROMORPHONE HCL 1 MG/ML IJ SOLN: 1.0000 mg | INTRAMUSCULAR | Status: DC | PRN
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
## 2018-11-24 NOTE — Progress Notes (Signed)
Patient was discharged with dc instructions and packet. He reports he has no further questions regarding discharged. Patient provided BSC/Rolling walker for home use.
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
## 2018-11-24 NOTE — Progress Notes (Signed)
Still with some but decreasing pain  L>R scrotal tenderness. Minimal drainage  Drain  Removed  OK for d/c from ouur standpoint.  We will schedule followup in 2 weeks  D/c on 5 days of abx
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
## 2018-11-25 ENCOUNTER — Encounter (HOSPITAL_COMMUNITY): Payer: Self-pay | Admitting: *Deleted
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
